# Patient Record
Sex: Female | Born: 1940 | Race: White | Hispanic: No | Smoking: Never smoker
Health system: Southern US, Community
[De-identification: ages and names within clinical notes are randomized; demographics above are authoritative.]

## PROBLEM LIST (undated history)

## (undated) DIAGNOSIS — F419 Anxiety disorder, unspecified: Secondary | ICD-10-CM

## (undated) DIAGNOSIS — K219 Gastro-esophageal reflux disease without esophagitis: Secondary | ICD-10-CM

## (undated) DIAGNOSIS — M1990 Unspecified osteoarthritis, unspecified site: Secondary | ICD-10-CM

## (undated) DIAGNOSIS — I82409 Acute embolism and thrombosis of unspecified deep veins of unspecified lower extremity: Secondary | ICD-10-CM

## (undated) DIAGNOSIS — F319 Bipolar disorder, unspecified: Secondary | ICD-10-CM

## (undated) HISTORY — PX: KNEE ARTHROSCOPY: SUR90

---

## 2011-10-17 ENCOUNTER — Emergency Department (HOSPITAL_COMMUNITY): Payer: Medicare Other

## 2011-10-17 ENCOUNTER — Encounter (HOSPITAL_COMMUNITY): Payer: Self-pay | Admitting: Emergency Medicine

## 2011-10-17 ENCOUNTER — Emergency Department (HOSPITAL_COMMUNITY)
Admission: EM | Admit: 2011-10-17 | Discharge: 2011-10-18 | Disposition: A | Discharge status: Other Acute Inpatient Hospital | Payer: Medicare Other | Attending: Emergency Medicine | Admitting: Emergency Medicine

## 2011-10-17 DIAGNOSIS — F29 Unspecified psychosis not due to a substance or known physiological condition: Secondary | ICD-10-CM | POA: Insufficient documentation

## 2011-10-17 DIAGNOSIS — Z9181 History of falling: Secondary | ICD-10-CM | POA: Insufficient documentation

## 2011-10-17 DIAGNOSIS — R509 Fever, unspecified: Secondary | ICD-10-CM | POA: Insufficient documentation

## 2011-10-17 DIAGNOSIS — F22 Delusional disorders: Secondary | ICD-10-CM | POA: Insufficient documentation

## 2011-10-17 DIAGNOSIS — F319 Bipolar disorder, unspecified: Secondary | ICD-10-CM | POA: Insufficient documentation

## 2011-10-17 DIAGNOSIS — R5381 Other malaise: Secondary | ICD-10-CM | POA: Insufficient documentation

## 2011-10-17 DIAGNOSIS — R4182 Altered mental status, unspecified: Secondary | ICD-10-CM | POA: Insufficient documentation

## 2011-10-17 HISTORY — DX: Anxiety disorder, unspecified: F41.9

## 2011-10-17 HISTORY — DX: Acute embolism and thrombosis of unspecified deep veins of unspecified lower extremity: I82.409

## 2011-10-17 HISTORY — DX: Bipolar disorder, unspecified: F31.9

## 2011-10-17 LAB — RAPID URINE DRUG SCREEN, HOSP PERFORMED
Amphetamines: NOT DETECTED
Barbiturates: NOT DETECTED
Tetrahydrocannabinol: NOT DETECTED

## 2011-10-17 LAB — URINALYSIS, ROUTINE W REFLEX MICROSCOPIC
Glucose, UA: NEGATIVE mg/dL
Leukocytes, UA: NEGATIVE
Specific Gravity, Urine: 1.025 (ref 1.005–1.030)
pH: 6 (ref 5.0–8.0)

## 2011-10-17 LAB — BASIC METABOLIC PANEL
CO2: 27 mEq/L (ref 19–32)
Chloride: 103 mEq/L (ref 96–112)
Creatinine, Ser: 0.62 mg/dL (ref 0.50–1.10)

## 2011-10-17 LAB — DIFFERENTIAL
Basophils Absolute: 0 10*3/uL (ref 0.0–0.1)
Lymphocytes Relative: 21 % (ref 12–46)
Monocytes Absolute: 0.4 10*3/uL (ref 0.1–1.0)
Monocytes Relative: 8 % (ref 3–12)
Neutro Abs: 3.8 10*3/uL (ref 1.7–7.7)

## 2011-10-17 LAB — CBC
HCT: 38.7 % (ref 36.0–46.0)
Hemoglobin: 12.6 g/dL (ref 12.0–15.0)
RDW: 13.2 % (ref 11.5–15.5)
WBC: 5.5 10*3/uL (ref 4.0–10.5)

## 2011-10-17 LAB — HEPATIC FUNCTION PANEL
ALT: 11 U/L (ref 0–35)
Alkaline Phosphatase: 74 U/L (ref 39–117)
Total Protein: 7.3 g/dL (ref 6.0–8.3)

## 2011-10-17 MED ORDER — OLANZAPINE 2.5 MG PO TABS
2.5000 mg | ORAL_TABLET | Freq: Every day | ORAL | Status: DC
  Administered 2011-10-18: 2.5 mg via ORAL
  Filled 2011-10-17 (×3): qty 1

## 2011-10-17 MED ORDER — VENLAFAXINE HCL ER 75 MG PO CP24
75.0000 mg | ORAL_CAPSULE | Freq: Every morning | ORAL | Status: DC
Start: 2011-10-17 — End: 2011-10-19
  Administered 2011-10-17 – 2011-10-18 (×2): 75 mg via ORAL
  Filled 2011-10-17 (×6): qty 1

## 2011-10-17 NOTE — ED Notes (Addendum)
Sons called EMS due to pt being paranoid. Putting phone off hook and continually looking out windows and doors. Sons state does not see difference in confusion, just the paranoia. Husband told ems that she has not urinated or had bm x 1 week. Sons say they just saw her urinate just yesterday and father exaggerates a lot. Pt is alert/withdrawn. Answers simple questions. States she has some pain in her back-chronic. Pt follows commands at this time. Moving all extremities. Pupils perrla. No resp distress. Nondiaphoretic. Pt denies si/hi

## 2011-10-17 NOTE — ED Provider Notes (Signed)
History   This chart was scribed for [employer]. Rubin Payor, MD by Clarita Crane. The patient was seen in room APA02/APA02. Patient's care was started at 0946.    CSN: [id number]  Arrival date & time 10/17/11  4098   First MD Initiated Contact with Patient 10/17/11 1000      Chief Complaint  Patient presents with  . Altered Mental Status    (Consider location/radiation/quality/duration/timing/severity/associated sxs/prior treatment) HPI A Level 5 Caveat applies due to Altered Mental Status Staley Jeansonne is a 71 y.o. female who presents to the Emergency Department accompanied by family members who reports patient has displayed gradually worsening altered mental status described as increased paranoia and confusion that began 10 days ago. Son reports that patient has also displayed increased fatigue and subjective fever the past several days. Patient's sons also note that patient sustained fall yesterday with associated head injury. Denies abdominal pain, chest pain, HA, neck pain. Family members not patient with h/o abuse of Xanax and states patient was recently admitted to [facility] for an extended period of time and has not done well since she was discharged home from facility.   Past Medical History  Diagnosis Date  . Bipolar 1 disorder   . Anxiety   . DVT (deep venous thrombosis)     Past Surgical History  Procedure Date  . Knee arthroscopy     History reviewed. No pertinent family history.  History  Substance Use Topics  . Smoking status: Never Smoker   . Smokeless tobacco: Not on file  . Alcohol Use: No  -Lives with spouse  OB History    Grav Para Term Preterm Abortions TAB SAB Ect Mult Living                  Review of Systems  Unable to perform ROS: Mental status change     Allergies  Penicillins  Home Medications  No current outpatient prescriptions on file.  BP 97/57  Pulse 68  Temp(Src) 98.3 F (36.8 C) (Oral)  Resp 18  Ht 5\' 6"  (1.676  m)  Wt 110 lb (49.896 kg)  BMI 17.75 kg/m2  SpO2 97%  Physical Exam  Nursing note and vitals reviewed. Constitutional: She appears well-developed and well-nourished. No distress.  HENT:  Head: Normocephalic and atraumatic.  Eyes: EOM are normal. Pupils are equal, round, and reactive to light.  Neck: Neck supple. No tracheal deviation present.  Cardiovascular: Normal rate.   Pulmonary/Chest: Effort normal. No respiratory distress.  Abdominal: Soft. She exhibits no distension.  Musculoskeletal: Normal range of motion. She exhibits no edema.       C-spine non-tender.  Neurological: She is alert. No sensory deficit.       Moves all extremities. Decreased cooperativeness. Answers yes and no to questions.    Skin: Skin is warm and dry.  Psychiatric: She has a normal mood and affect. Her behavior is normal.    ED Course  Procedures (including critical care time)  DIAGNOSTIC STUDIES: Oxygen Saturation is 97% on room air, normal by my interpretation.    COORDINATION OF CARE: 10:08AM- History obtained from patient's 2 sons outside of exam room. Current plan explained and discussed. Patient's sons agree with plan at this time.    Labs Reviewed  BASIC METABOLIC PANEL - Abnormal; Notable for the following:    GFR calc non Af Amer 87 (*)    All other components within normal limits  URINALYSIS, ROUTINE W REFLEX MICROSCOPIC  CBC  DIFFERENTIAL  HEPATIC  FUNCTION PANEL  URINE RAPID DRUG SCREEN (HOSP PERFORMED)   Dg Chest 2 View  10/17/2011  *RADIOLOGY REPORT*  Clinical Data: Confusion and mental status changes.  CHEST - 2 VIEW  Comparison: None.  Findings: The lungs are clear and show no evidence of edema or infiltrate.  No nodules or effusions are identified.  The heart size and mediastinal contours are unremarkable.  In the lateral projection, there is evidence of mild loss of height of the T8 vertebral body, likely representing mild osteoporotic compression with approximately 30% loss of  mid vertebral body height.  IMPRESSION: No active disease.  Mild compression deformity of the T8 vertebral body.  Original Report Authenticated By: Reola Calkins, M.D.   Ct Head Wo Contrast  10/17/2011  *RADIOLOGY REPORT*  Clinical Data: Altered mental status  CT HEAD WITHOUT CONTRAST  Technique:  Contiguous axial images were obtained from the base of the skull through the vertex without contrast.  Comparison: None.  Findings: Normal brain volume for age.  Ventricle size is normal. Small hypodensity left parietal white matter appears chronic.  No acute infarct, hemorrhage, or mass.  No skull lesions.  IMPRESSION: No acute abnormality.  Original Report Authenticated By: Camelia Phenes, M.D.     No diagnosis found. Bipolar disorder   Date: 10/17/2011  Rate: 68  Rhythm: normal sinus rhythm  QRS Axis: normal  Intervals: normal  ST/T Wave abnormalities: normal  Conduction Disutrbances:none  Narrative Interpretation:   Old EKG Reviewed: none available     MDM  Patient with increasing altered mental status at home. She's had slight issues is taking care of her husband. She's had been inpatient at [facility]. Laboratories overall reassuring. She appears to be medically cleared at this time. She's been seen by ACT team and is pending placement.     I personally performed the services described in this documentation, which was scribed in my presence. The recorded information has been reviewed and considered.    Juliet Rude. Rubin Payor, MD 10/17/11 1434
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
## 2011-10-17 NOTE — ED Notes (Signed)
Spoke with Roxanne at [facility]. Will call back about pt

## 2011-10-17 NOTE — ED Notes (Signed)
Pt refusing to eat dinner.

## 2011-10-17 NOTE — ED Notes (Signed)
Warm blanket given to pt

## 2011-10-18 NOTE — ED Notes (Signed)
Pt voided large amount in bedside commode.

## 2011-10-18 NOTE — ED Notes (Signed)
Patient resting quietly in bed; continues to be nonverbal.

## 2011-10-18 NOTE — ED Notes (Signed)
Gave pt. Bed bath .Marland Kitchen Pt talked a little.Marland Kitchen Opened eyes. Tried to feed pt but pt refused. Drank a little water

## 2011-10-18 NOTE — ED Notes (Signed)
Pt sitting up with eyes open and talking. Drinks water from a straw while I hold a cup. Refuses to eat. States "just leave it sitting there". Pt states that she does not need to urinate.

## 2011-10-18 NOTE — ED Notes (Signed)
Left with Deputy  1 female and 1 female Technical sales engineer

## 2011-10-18 NOTE — Progress Notes (Signed)
Pt accepted for transfer to [facility] facility by Dr. Retia Passe.

## 2011-10-18 NOTE — ED Notes (Signed)
Awaiting transport - communication from [employer] - should be here in about 15 minutes.  Son sitting with patient. Informed.

## 2011-10-18 NOTE — ED Notes (Signed)
Pt slightly opens eyes to verbal stimuli. No verbal response. Moving arms.

## 2011-10-18 NOTE — ED Notes (Signed)
Pt remains nonverbal. Pt moves right arm but does not respond verbally or follow commands. Pt notified that her son called to check on her and family will be here to visit in a little while. Pt dry and clean. Cardiac monitor showing NSR.

## 2011-10-18 NOTE — ED Notes (Signed)
Pt's son in to visit. Pt eating lunch tray for son. Awake and alert at this time. Pt still does not need to use bedpan. Pharmacy notified to bring patient her med so that her son can help with administering it.

## 2011-10-18 NOTE — ED Notes (Signed)
Pt lying in bed with eyes closed. Does not respond to verbal or tactile stimuli. Pt showing NSR on cardiac monitor. Respirations deep and even. Breakfast tray left at bedside and patient notified.

## 2011-10-18 NOTE — Progress Notes (Signed)
Spoke with April who confirmed pt is accepted to Dr. Janice Norrie at [facility], however she has requested an affidavit and transportation. Informed her we have always transported to [facility] on Emergency Certificate which has the transportation order on the back page and the emergency certificate. She will check on this and call back. Discussed with pt's nurse.

## 2011-10-18 NOTE — ED Notes (Signed)
Still waiting for paperwork to be done and final approval from [place]. Pt remains alert and oriented. Family at bedside. No complaints at this time.

## 2011-10-18 NOTE — ED Notes (Signed)
Pt has been accepted to [facility] pending IVC papers being faxed. Dr. Ignacia Palma working on papers.

## 2011-10-18 NOTE — ED Notes (Signed)
Pt turned on her right side. Pt has no redness or skin breakdown noted. Pt drank water through a straw but kept spitting out her pill and shaking her head no. Pt dry and clean. Bedpan offered.

## 2011-10-18 NOTE — ED Notes (Signed)
Report called to [facility] to Celestia Khat, RN  Dep[place] [employer] here and talked with patient and family.  He is making arrangments for transport officer to come to [facility] rather than take patient to sheriffs office - This is being done in best interest of this patient who has paranoia related to being arrested.    Patient placed in her own pajama's.  Responding appropriately and assisted with getting out of her gown and clothing on.  Denies need for toileting at this time.  Has one gold colored ring on left ring finger. Dentures - upper and lower in mouth.

## 2011-10-18 NOTE — ED Notes (Signed)
Pt awake and alert. Moving about on bed with no difficulty. Taking po liquids and ate 1/4 of lunch tray. Pt's son at bedside. Pt has no needs at this time.

## 2011-10-18 NOTE — ED Notes (Signed)
Patient resting quietly in bed with eyes closed.  Patient will open eyes when door to room is opened; however remains nonverbal to all questions.  Patient pulls cover up further when approached by staff.

## 2011-10-18 NOTE — ED Notes (Signed)
Pt accepted to [facility] 

## 2011-10-18 NOTE — ED Notes (Signed)
Swallowed medication without complaint or difficulty.  Son states this is the best he has seen his mother in the past two weeks.  Cooperative.

## 2011-10-18 NOTE — ED Notes (Signed)
Patient resting quietly in bed; patient does not respond when asked questions.  Will continue to monitor.

## 2011-10-18 NOTE — ED Notes (Signed)
Daughter in law in to visit with patient. Opens eyes but no verbal response noted.

## 2011-10-18 NOTE — Progress Notes (Signed)
Staff at [facility] are considering accepting pt but want her involuntarily committed.  I completed involuntary commitment papers on her.  She is resting comfortably, with her son at the bedside.

## 2011-10-18 NOTE — ED Notes (Signed)
Patient remains nonverbal to all questions.  Respirations even and unlabored, equal rise and fall of chest noted.

## 2011-10-18 NOTE — ED Notes (Signed)
Pt still not opening eyes for family or for me. Pt does not follow commands. Pt sat up and took a couple very small sips of water while I held the cup to her lips. Remains non verbal.

## 2011-10-18 NOTE — Progress Notes (Signed)
Spoke with April at [facility] who reports the Doctor was unable to review case yesterday but it will be reviewed today.

## 2011-10-18 NOTE — ED Notes (Signed)
Pt sitting on stretcher feeding herself dinner. Family at bedside. Pt talking and answering questions. No c/o pain at this time. Follows commands at this time. Calm and cooperative.

## 2013-08-12 IMAGING — CR DG CHEST 2V
2 series · 2 of 2 positions shown · non-contrast
Comparison: None.

CLINICAL DATA: Confusion and mental status changes.

CHEST - 2 VIEW

[view not recorded (1 of 2)]
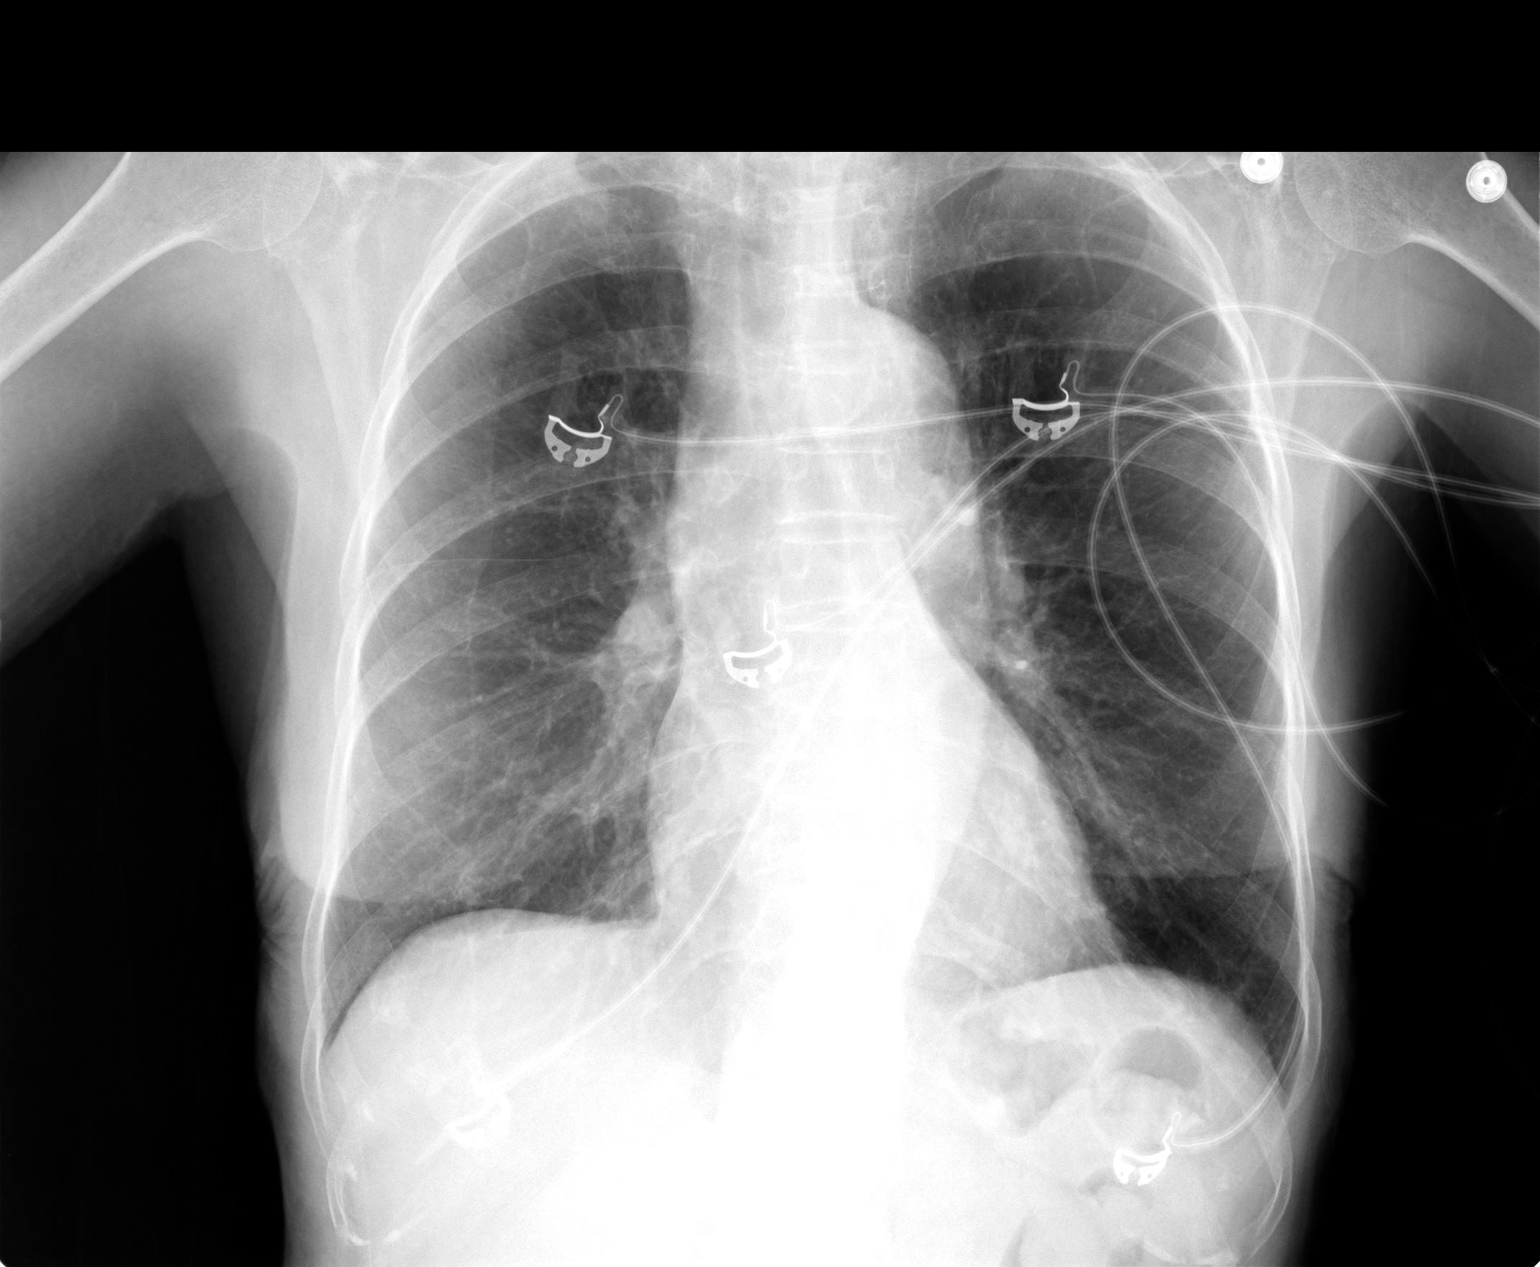

[view not recorded (2 of 2)]
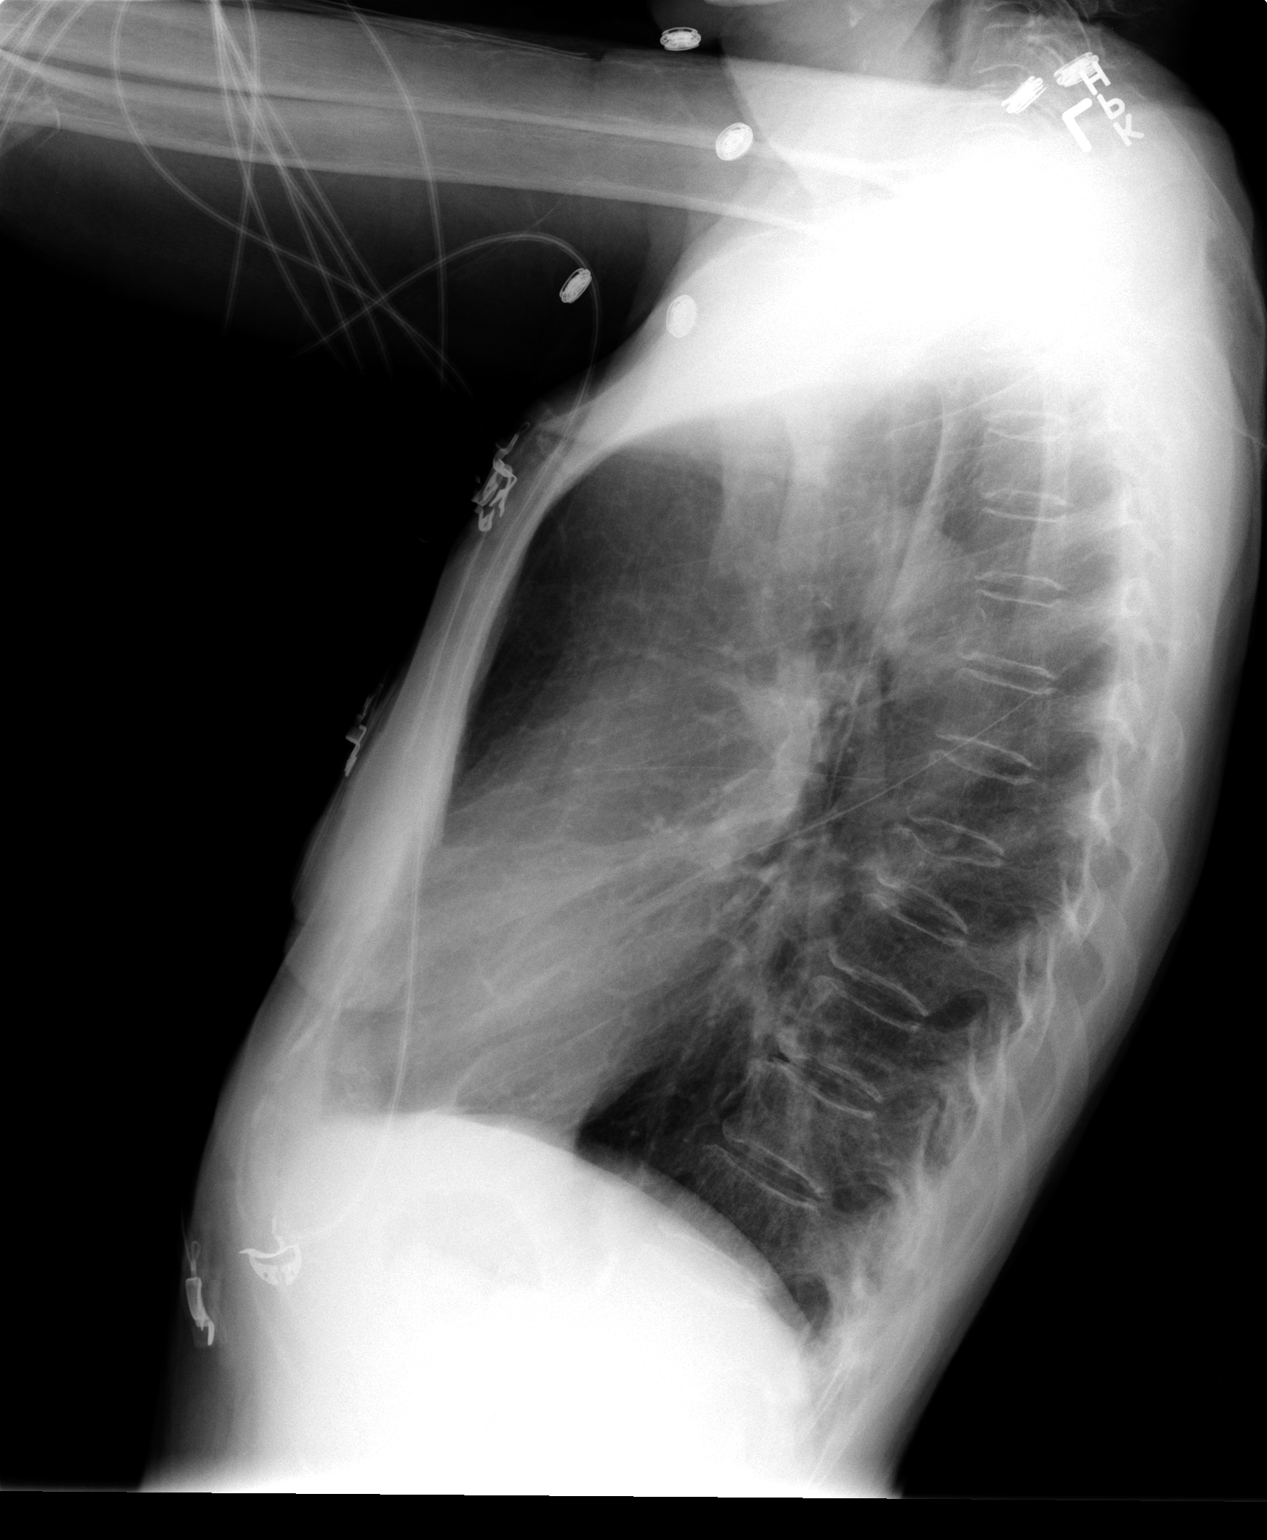

[2 of 2 positions shown; findings below may reference images not displayed]

FINDINGS: The lungs are clear and show no evidence of edema or
infiltrate.  No nodules or effusions are identified.  The heart
size and mediastinal contours are unremarkable.  In the lateral
projection, there is evidence of mild loss of height of the T8
vertebral body, likely representing mild osteoporotic compression
with approximately 30% loss of mid vertebral body height.
IMPRESSION: No active disease.  Mild compression deformity of the T8 vertebral
body.

## 2016-04-23 NOTE — Patient Instructions (Signed)
[facility]  04/23/2016     @PREFPERIOPPHARMACY @   Your procedure is scheduled on 04/29/2016.  Report to [facility] at 6:15 A.M.  Call this number if you have problems the morning of surgery:  [phone]   Remember:  Do not eat food or drink liquids after midnight.  Take these medicines the morning of surgery with A SIP OF WATER Remeron, Prilosec   Do not wear jewelry, make-up or nail polish.  Do not wear lotions, powders, or perfumes, or deoderant.  Do not shave 48 hours prior to surgery.  Men may shave face and neck.  Do not bring valuables to the hospital.  [facility] is not responsible for any belongings or valuables.  Contacts, dentures or bridgework may not be worn into surgery.  Leave your suitcase in the car.  After surgery it may be brought to your room.  For patients admitted to the hospital, discharge time will be determined by your treatment team.  Patients discharged the day of surgery will not be allowed to drive home.    Please read over the following fact sheets that you were given. Anesthesia Post-op Instructions     PATIENT INSTRUCTIONS POST-ANESTHESIA  IMMEDIATELY FOLLOWING SURGERY:  Do not drive or operate machinery for the first twenty four hours after surgery.  Do not make any important decisions for twenty four hours after surgery or while taking narcotic pain medications or sedatives.  If you develop intractable nausea and vomiting or a severe headache please notify your doctor immediately.  FOLLOW-UP:  Please make an appointment with your surgeon as instructed. You do not need to follow up with anesthesia unless specifically instructed to do so.  WOUND CARE INSTRUCTIONS (if applicable):  Keep a dry clean dressing on the anesthesia/puncture wound site if there is drainage.  Once the wound has quit draining you may leave it open to air.  Generally you should leave the bandage intact for twenty four hours unless there is drainage.  If the  epidural site drains for more than 36-48 hours please call the anesthesia department.  QUESTIONS?:  Please feel free to call your physician or the hospital operator if you have any questions, and they will be happy to assist you.      Cataract Surgery Cataract surgery is a procedure to remove a cataract from your eye. A cataract is cloudiness on the lens of your eye. The lens focuses light inside the eye. When a lens becomes cloudy, your vision is affected. Cataract surgery is a procedure to remove the cloudy lens. A substitute lens (intraocular lens or IOL) is usually inserted as a replacement for the cloudy lens. Tell a health care provider about:  Any allergies you have.  All medicines you are taking, including vitamins, herbs, eye drops, creams, and over-the-counter medicines.  Any problems you or family members have had with anesthetic medicines.  Any blood disorders you have.  Any surgeries you have had, especially eye surgeries that include refractive surgery, such as PRK and LASIK.  Any medical conditions you have.  Whether you are pregnant or may be pregnant. What are the risks? Generally, this is a safe procedure. However, problems may occur, including:  Infection.  Bleeding.  Glaucoma.  Retinal detachment.  Allergic reactions to medicines.  Damage to other structures or organs.  Inflammation of the eye.  Clouding of the part of your eye that holds an IOL in place (after-cataract), if an IOL was inserted. This is fairly common.  An IOL moving out of position, if an IOL was inserted. This is very rare.  Loss of vision. This is rare. What happens before the procedure?  Follow instructions from your health care provider about eating or drinking restrictions.  Ask your health care provider about:  Changing or stopping your regular medicines, including any eye drops you have been prescribed. This is especially important if you are taking diabetes medicines or  blood thinners.  Taking medicines such as aspirin and ibuprofen. These medicines can thin your blood. Do not take these medicines before your procedure if your health care provider instructs you not to.  Do not put contact lenses in either eye on the day of your surgery.  Plan for someone to drive you to and from the procedure.  If you will be going home right after the procedure, plan to have someone with you for 24 hours. What happens during the procedure?  An IV tube may be inserted into one of your veins.  You will be given one or more of the following:  A medicine to help you relax (sedative).  A medicine to numb the area (local anesthetic). This may be numbing eye drops or an injection that is given behind the eye.  A small cut (incision) will be made to the edge of the clear, dome-shaped surface that covers the front of the eye (cornea).  A small probe will be inserted into the eye. This device gives off ultrasound waves that soften and break up the cloudy center of the lens. This makes it easier for the cloudy lens to be removed by suction.  An IOL may be implanted.  Part of the capsule that surrounds the lens will be left in the eye to support the IOL.  Your surgeon may use stitches (sutures) to close the incision. The procedure may vary among health care providers and hospitals. What happens after the procedure?  Your blood pressure, heart rate, breathing rate, and blood oxygen level will be monitored often until the medicines you were given have worn off.  You may be given a protective shield to wear over your eyes.  Do not drive for 24 hours if you received a sedative. This information is not intended to replace advice given to you by your health care provider. Make sure you discuss any questions you have with your health care provider. Document Released: 04/25/2011 Document Revised: 10/12/2015 Document Reviewed: 03/16/2015 Elsevier Interactive Patient Education   2017 [employer].

## 2016-04-24 ENCOUNTER — Encounter (HOSPITAL_COMMUNITY): Payer: Self-pay

## 2016-04-24 ENCOUNTER — Encounter (HOSPITAL_COMMUNITY)
Admission: RE | Admit: 2016-04-24 | Discharge: 2016-04-24 | Disposition: A | Discharge status: Home / Self Care | Payer: Medicare Other | Source: Ambulatory Visit | Attending: Ophthalmology | Admitting: Ophthalmology

## 2016-04-24 DIAGNOSIS — Z01810 Encounter for preprocedural cardiovascular examination: Secondary | ICD-10-CM | POA: Diagnosis present

## 2016-04-24 DIAGNOSIS — Z86718 Personal history of other venous thrombosis and embolism: Secondary | ICD-10-CM | POA: Diagnosis not present

## 2016-04-24 DIAGNOSIS — F319 Bipolar disorder, unspecified: Secondary | ICD-10-CM | POA: Diagnosis not present

## 2016-04-24 DIAGNOSIS — K219 Gastro-esophageal reflux disease without esophagitis: Secondary | ICD-10-CM | POA: Diagnosis not present

## 2016-04-24 DIAGNOSIS — F419 Anxiety disorder, unspecified: Secondary | ICD-10-CM | POA: Diagnosis not present

## 2016-04-24 DIAGNOSIS — Z01812 Encounter for preprocedural laboratory examination: Secondary | ICD-10-CM | POA: Diagnosis not present

## 2016-04-24 DIAGNOSIS — M1990 Unspecified osteoarthritis, unspecified site: Secondary | ICD-10-CM | POA: Diagnosis not present

## 2016-04-24 HISTORY — DX: Unspecified osteoarthritis, unspecified site: M19.90

## 2016-04-24 HISTORY — DX: Gastro-esophageal reflux disease without esophagitis: K21.9

## 2016-04-24 LAB — BASIC METABOLIC PANEL
ANION GAP: 6 (ref 5–15)
BUN: 15 mg/dL (ref 6–20)
CHLORIDE: 105 mmol/L (ref 101–111)
CO2: 27 mmol/L (ref 22–32)
Calcium: 9.3 mg/dL (ref 8.9–10.3)
Creatinine, Ser: 0.74 mg/dL (ref 0.44–1.00)
GFR calc non Af Amer: >60 mL/min (ref >60)
Glucose, Bld: 95 mg/dL (ref 65–99)
POTASSIUM: 4.5 mmol/L (ref 3.5–5.1)
SODIUM: 138 mmol/L (ref 135–145)

## 2016-04-24 LAB — CBC WITH DIFFERENTIAL/PLATELET
BASOS PCT: 1 %
Basophils Absolute: 0.1 10*3/uL (ref 0.0–0.1)
EOS ABS: 0.2 10*3/uL (ref 0.0–0.7)
Eosinophils Relative: 3 %
HCT: 37.4 % (ref 36.0–46.0)
HEMOGLOBIN: 11.9 g/dL — AB (ref 12.0–15.0)
Lymphocytes Relative: 32 %
Lymphs Abs: 1.8 10*3/uL (ref 0.7–4.0)
MCH: 29.1 pg (ref 26.0–34.0)
MCHC: 31.8 g/dL (ref 30.0–36.0)
MCV: 91.4 fL (ref 78.0–100.0)
Monocytes Absolute: 0.6 10*3/uL (ref 0.1–1.0)
Monocytes Relative: 10 %
NEUTROS ABS: 3 10*3/uL (ref 1.7–7.7)
NEUTROS PCT: 54 %
Platelets: 319 10*3/uL (ref 150–400)
RBC: 4.09 MIL/uL (ref 3.87–5.11)
RDW: 13.8 % (ref 11.5–15.5)
WBC: 5.6 10*3/uL (ref 4.0–10.5)

## 2016-04-29 ENCOUNTER — Ambulatory Visit (HOSPITAL_COMMUNITY)
Admission: RE | Admit: 2016-04-29 | Discharge: 2016-04-29 | Disposition: A | Discharge status: Home / Self Care | Payer: Medicare Other | Source: Ambulatory Visit | Attending: Ophthalmology | Admitting: Ophthalmology

## 2016-04-29 ENCOUNTER — Ambulatory Visit (HOSPITAL_COMMUNITY): Payer: Medicare Other | Admitting: Anesthesiology

## 2016-04-29 ENCOUNTER — Encounter (HOSPITAL_COMMUNITY)
Admission: RE | Disposition: A | Discharge status: Home / Self Care | Payer: Self-pay | Source: Ambulatory Visit | Attending: Ophthalmology

## 2016-04-29 ENCOUNTER — Encounter (HOSPITAL_COMMUNITY): Payer: Self-pay | Admitting: *Deleted

## 2016-04-29 DIAGNOSIS — F419 Anxiety disorder, unspecified: Secondary | ICD-10-CM | POA: Diagnosis not present

## 2016-04-29 DIAGNOSIS — H2589 Other age-related cataract: Secondary | ICD-10-CM | POA: Insufficient documentation

## 2016-04-29 DIAGNOSIS — Z86718 Personal history of other venous thrombosis and embolism: Secondary | ICD-10-CM | POA: Diagnosis not present

## 2016-04-29 DIAGNOSIS — Z79899 Other long term (current) drug therapy: Secondary | ICD-10-CM | POA: Insufficient documentation

## 2016-04-29 DIAGNOSIS — K219 Gastro-esophageal reflux disease without esophagitis: Secondary | ICD-10-CM | POA: Insufficient documentation

## 2016-04-29 HISTORY — PX: CATARACT EXTRACTION W/PHACO: SHX586

## 2016-04-29 SURGERY — PHACOEMULSIFICATION, CATARACT, WITH IOL INSERTION
Anesthesia: Monitor Anesthesia Care | Site: Eye | Laterality: Right

## 2016-04-29 MED ORDER — BSS IO SOLN
INTRAOCULAR | Status: DC | PRN
  Administered 2016-04-29: 15 mL

## 2016-04-29 MED ORDER — LACTATED RINGERS IV SOLN
INTRAVENOUS | Status: DC
  Administered 2016-04-29: 07:00:00 via INTRAVENOUS

## 2016-04-29 MED ORDER — LIDOCAINE HCL 3.5 % OP GEL
1.0000 "application " | Freq: Once | OPHTHALMIC | Status: AC
  Administered 2016-04-29: 1 via OPHTHALMIC

## 2016-04-29 MED ORDER — TRYPAN BLUE 0.06 % OP SOLN
OPHTHALMIC | Status: DC | PRN
  Administered 2016-04-29: 0.5 mL via INTRAOCULAR

## 2016-04-29 MED ORDER — EPINEPHRINE PF 1 MG/ML IJ SOLN
INTRAMUSCULAR | Status: AC
  Filled 2016-04-29: qty 1

## 2016-04-29 MED ORDER — FENTANYL CITRATE (PF) 100 MCG/2ML IJ SOLN
25.0000 ug | INTRAMUSCULAR | Status: AC | PRN
  Administered 2016-04-29 (×2): 25 ug via INTRAVENOUS
  Filled 2016-04-29: qty 2

## 2016-04-29 MED ORDER — TETRACAINE HCL 0.5 % OP SOLN
1.0000 [drp] | OPHTHALMIC | Status: AC
Start: 2016-04-29 — End: 2016-04-29
  Administered 2016-04-29 (×3): 1 [drp] via OPHTHALMIC

## 2016-04-29 MED ORDER — CYCLOPENTOLATE-PHENYLEPHRINE 0.2-1 % OP SOLN
1.0000 [drp] | OPHTHALMIC | Status: AC
  Administered 2016-04-29 (×3): 1 [drp] via OPHTHALMIC

## 2016-04-29 MED ORDER — EPINEPHRINE PF 1 MG/ML IJ SOLN
INTRAOCULAR | Status: DC | PRN
  Administered 2016-04-29: 500 mL

## 2016-04-29 MED ORDER — POVIDONE-IODINE 5 % OP SOLN
OPHTHALMIC | Status: DC | PRN
  Administered 2016-04-29: 1 via OPHTHALMIC

## 2016-04-29 MED ORDER — NEOMYCIN-POLYMYXIN-DEXAMETH 3.5-10000-0.1 OP SUSP
OPHTHALMIC | Status: DC | PRN
  Administered 2016-04-29: 2 [drp] via OPHTHALMIC

## 2016-04-29 MED ORDER — PHENYLEPHRINE HCL 2.5 % OP SOLN
1.0000 [drp] | OPHTHALMIC | Status: AC
  Administered 2016-04-29 (×3): 1 [drp] via OPHTHALMIC

## 2016-04-29 MED ORDER — MIDAZOLAM HCL 2 MG/2ML IJ SOLN
1.0000 mg | INTRAMUSCULAR | Status: DC | PRN
  Administered 2016-04-29 (×2): 1 mg via INTRAVENOUS
  Filled 2016-04-29: qty 2

## 2016-04-29 MED ORDER — TRYPAN BLUE 0.06 % OP SOLN
OPHTHALMIC | Status: AC
  Filled 2016-04-29: qty 0.5

## 2016-04-29 MED ORDER — LIDOCAINE HCL (PF) 1 % IJ SOLN
INTRAOCULAR | Status: DC | PRN
  Administered 2016-04-29: .8 mL via OPHTHALMIC

## 2016-04-29 MED ORDER — NA HYALUR & NA CHOND-NA HYALUR 0.55-0.5 ML IO KIT
PACK | INTRAOCULAR | Status: DC | PRN
  Administered 2016-04-29: 1 via OPHTHALMIC

## 2016-04-29 SURGICAL SUPPLY — 12 items
CLOTH BEACON ORANGE TIMEOUT ST (SAFETY) ×3 IMPLANT
EYE SHIELD UNIVERSAL CLEAR (GAUZE/BANDAGES/DRESSINGS) ×3 IMPLANT
GLOVE BIOGEL PI IND STRL 6.5 (GLOVE) ×1 IMPLANT
GLOVE BIOGEL PI IND STRL 8 (GLOVE) ×1 IMPLANT
GLOVE BIOGEL PI INDICATOR 6.5 (GLOVE) ×2
GLOVE BIOGEL PI INDICATOR 8 (GLOVE) ×2
PAD ARMBOARD 7.5X6 YLW CONV (MISCELLANEOUS) ×3 IMPLANT
SIGHTPATH CAT PROC W REG LENS (Ophthalmic Related) ×3 IMPLANT
SYRINGE LUER LOK 1CC (MISCELLANEOUS) ×3 IMPLANT
TAPE SURG TRANSPORE 1 IN (GAUZE/BANDAGES/DRESSINGS) ×1 IMPLANT
TAPE SURGICAL TRANSPORE 1 IN (GAUZE/BANDAGES/DRESSINGS) ×2
WATER STERILE IRR 250ML POUR (IV SOLUTION) ×3 IMPLANT

## 2016-04-29 NOTE — H&P (Signed)
I have reviewed the H&P, the patient was re-examined, and I have identified no interval changes in medical condition and plan of care since the history and physical of record  

## 2016-04-29 NOTE — Transfer of Care (Signed)
Immediate Anesthesia Transfer of Care Note  Patient: [facility]ilar  Procedure(s) Performed: Procedure(s) with comments: CATARACT EXTRACTION PHACO AND INTRAOCULAR LENS PLACEMENT RIGHT EYE CDE=72.03 (Right) - right  Patient Location: Short Stay  Anesthesia Type:MAC  Level of Consciousness: awake and patient cooperative  Airway & Oxygen Therapy: Patient Spontanous Breathing  Post-op Assessment: Report given to RN, Post -op Vital signs reviewed and stable and Patient moving all extremities  Post vital signs: Reviewed and stable  Last Vitals:  Vitals:   04/29/16 0745 04/29/16 0750  BP: (!) 132/59 121/89  Pulse:    Resp: 16 11  Temp:      Last Pain:  Vitals:   04/29/16 0719  TempSrc: Oral      Patients Stated Pain Goal: 5 (04/29/16 0719)  Complications: No apparent anesthesia complications

## 2016-04-29 NOTE — Op Note (Signed)
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  Assistants: None  Anesthesia: Topical with MAC  Indications: Painless, progressive loss of vision with compromise of daily activities.  Surgery: Cataract Extraction with Intraocular lens Implant Right Eye  Discription: The patient had dilating drops and viscous lidocaine placed into the Right eye in the pre-op holding area. After transfer to the operating room, a time out was performed. The patient was then prepped and draped. Beginning with a 75 degree blade a paracentesis port was made at the surgeon's 2 o'clock position. The anterior chamber was then filled with 1% non-preserved lidocaine. The anterior chamber was then filled with Vision Blue to stain the anterior capsule. The Vision Blue was displaced from the anterior chamber with BSS. This was followed by filling the anterior chamber with Viscoat. A 2.264mm keratome blade was used to make a clear corneal incision at the temporal limbus. A bent cystatome needle was used to create a continuous tear capsulotomy. Hydrodissection was performed with balanced salt solution on a Fine canula. The lens nucleus was then removed using the phacoemulsification handpiece. Residual cortex was removed with the I&A handpiece. The anterior chamber and capsular bag were refilled with Provisc. A posterior chamber intraocular lens was placed into the capsular bag with it's injector. The implant was positioned with the Kuglan hook. The Provisc was then removed from the anterior chamber and capsular bag with the I&A handpiece. Stromal hydration of the main incision and paracentesis port was performed with BSS on a Fine canula. The wounds were tested for leak which was negative. The patient tolerated the procedure well. There were no operative complications. The patient was then  transferred to the recovery room in stable condition.  Complications: None  Specimen: None  EBL: None  Prosthetic device: Abbott Technis,PCB00, power 24.5D, SN [id number].

## 2016-04-29 NOTE — Anesthesia Postprocedure Evaluation (Signed)
Anesthesia Post Note  Patient: Kayia Schweiss  Procedure(s) Performed: Procedure(s) (LRB): CATARACT EXTRACTION PHACO AND INTRAOCULAR LENS PLACEMENT RIGHT EYE CDE=72.03 (Right)  Patient location during evaluation: Short Stay Anesthesia Type: MAC Level of consciousness: awake and alert, oriented and patient cooperative Pain management: pain level controlled Vital Signs Assessment: post-procedure vital signs reviewed and stable Respiratory status: spontaneous breathing, nonlabored ventilation and respiratory function stable Cardiovascular status: blood pressure returned to baseline Postop Assessment: no signs of nausea or vomiting Anesthetic complications: no    Last Vitals:  Vitals:   04/29/16 0745 04/29/16 0750  BP: (!) 132/59 121/89  Pulse:    Resp: 16 11  Temp:      Last Pain:  Vitals:   04/29/16 0719  TempSrc: Oral                 Jena Tegeler J

## 2016-04-29 NOTE — Anesthesia Preprocedure Evaluation (Signed)
Anesthesia Evaluation  Patient identified by MRN, date of birth, ID band Patient awake    Reviewed: Allergy & Precautions, NPO status , Patient's Chart, lab work & pertinent test results  Airway Mallampati: III  TM Distance: >3 FB   Mouth opening: Limited Mouth Opening  Dental  (+) Edentulous Upper, Edentulous Lower   Pulmonary neg pulmonary ROS,    breath sounds clear to auscultation       Cardiovascular + DVT   Rhythm:Regular Rate:Normal     Neuro/Psych PSYCHIATRIC DISORDERS Anxiety Bipolar Disorder    GI/Hepatic GERD  ,  Endo/Other    Renal/GU      Musculoskeletal  (+) Arthritis ,   Abdominal   Peds  Hematology   Anesthesia Other Findings   Reproductive/Obstetrics                             Anesthesia Physical Anesthesia Plan  ASA: III  Anesthesia Plan: MAC   Post-op Pain Management:    Induction: Intravenous  Airway Management Planned: Nasal Cannula  Additional Equipment:   Intra-op Plan:   Post-operative Plan:   Informed Consent: I have reviewed the patients History and Physical, chart, labs and discussed the procedure including the risks, benefits and alternatives for the proposed anesthesia with the patient or authorized representative who has indicated his/her understanding and acceptance.     Plan Discussed with:   Anesthesia Plan Comments:         Anesthesia Quick Evaluation  

## 2016-04-29 NOTE — Discharge Instructions (Signed)
Monitored Anesthesia Care, Care After °These instructions provide you with information about caring for yourself after your procedure. Your health care provider may also give you more specific instructions. Your treatment has been planned according to current medical practices, but problems sometimes occur. Call your health care provider if you have any problems or questions after your procedure. °What can I expect after the procedure? °After your procedure, it is common to: °· Feel sleepy for several hours. °· Feel clumsy and have poor balance for several hours. °· Feel forgetful about what happened after the procedure. °· Have poor judgment for several hours. °· Feel nauseous or vomit. °· Have a sore throat if you had a breathing tube during the procedure. °Follow these instructions at home: °For at least 24 hours after the procedure:  °· Do not: °¨ Participate in activities in which you could fall or become injured. °¨ Drive. °¨ Use heavy machinery. °¨ Drink alcohol. °¨ Take sleeping pills or medicines that cause drowsiness. °¨ Make important decisions or sign legal documents. °¨ Take care of children on your own. °· Rest. °Eating and drinking °· Follow the diet that is recommended by your health care provider. °· If you vomit, drink water, juice, or soup when you can drink without vomiting. °· Make sure you have little or no nausea before eating solid foods. °General instructions °· Have a responsible adult stay with you until you are awake and alert. °· Take over-the-counter and prescription medicines only as told by your health care provider. °· If you smoke, do not smoke without supervision. °· Keep all follow-up visits as told by your health care provider. This is important. °Contact a health care provider if: °· You keep feeling nauseous or you keep vomiting. °· You feel light-headed. °· You develop a rash. °· You have a fever. °Get help right away if: °· You have trouble breathing. °This information is not  intended to replace advice given to you by your health care provider. Make sure you discuss any questions you have with your health care provider. °Document Released: 08/27/2015 Document Revised: 12/27/2015 Document Reviewed: 08/27/2015 °Elsevier Interactive Patient Education © 2017 Elsevier Inc. ° °

## 2016-05-02 ENCOUNTER — Encounter (HOSPITAL_COMMUNITY): Payer: Self-pay | Admitting: Ophthalmology

## 2016-05-24 ENCOUNTER — Encounter (HOSPITAL_COMMUNITY)
Admission: RE | Admit: 2016-05-24 | Discharge: 2016-05-24 | Disposition: A | Discharge status: Home / Self Care | Payer: Medicare Other | Source: Ambulatory Visit | Attending: Ophthalmology | Admitting: Ophthalmology

## 2016-05-30 ENCOUNTER — Encounter (HOSPITAL_COMMUNITY): Payer: Self-pay | Admitting: *Deleted

## 2016-05-30 ENCOUNTER — Encounter (HOSPITAL_COMMUNITY)
Admission: RE | Disposition: A | Discharge status: Home / Self Care | Payer: Self-pay | Source: Ambulatory Visit | Attending: Ophthalmology

## 2016-05-30 ENCOUNTER — Ambulatory Visit (HOSPITAL_COMMUNITY): Payer: Medicare Other | Admitting: Anesthesiology

## 2016-05-30 ENCOUNTER — Ambulatory Visit (HOSPITAL_COMMUNITY)
Admission: RE | Admit: 2016-05-30 | Discharge: 2016-05-30 | Disposition: A | Discharge status: Home / Self Care | Payer: Medicare Other | Source: Ambulatory Visit | Attending: Ophthalmology | Admitting: Ophthalmology

## 2016-05-30 DIAGNOSIS — Z86718 Personal history of other venous thrombosis and embolism: Secondary | ICD-10-CM | POA: Diagnosis not present

## 2016-05-30 DIAGNOSIS — H259 Unspecified age-related cataract: Secondary | ICD-10-CM | POA: Insufficient documentation

## 2016-05-30 HISTORY — PX: CATARACT EXTRACTION W/PHACO: SHX586

## 2016-05-30 SURGERY — PHACOEMULSIFICATION, CATARACT, WITH IOL INSERTION
Anesthesia: Monitor Anesthesia Care | Site: Eye | Laterality: Left

## 2016-05-30 MED ORDER — TETRACAINE HCL 0.5 % OP SOLN
1.0000 [drp] | OPHTHALMIC | Status: AC
  Administered 2016-05-30 (×3): 1 [drp] via OPHTHALMIC

## 2016-05-30 MED ORDER — PHENYLEPHRINE HCL 2.5 % OP SOLN
1.0000 [drp] | OPHTHALMIC | Status: AC
  Administered 2016-05-30 (×3): 1 [drp] via OPHTHALMIC

## 2016-05-30 MED ORDER — MIDAZOLAM HCL 2 MG/2ML IJ SOLN
0.5000 mg | INTRAMUSCULAR | Status: DC | PRN
  Administered 2016-05-30: 2 mg via INTRAVENOUS

## 2016-05-30 MED ORDER — NEOMYCIN-POLYMYXIN-DEXAMETH 3.5-10000-0.1 OP SUSP
OPHTHALMIC | Status: DC | PRN
  Administered 2016-05-30: 2 [drp] via OPHTHALMIC

## 2016-05-30 MED ORDER — LIDOCAINE HCL (PF) 1 % IJ SOLN
INTRAOCULAR | Status: DC | PRN
  Administered 2016-05-30: .8 mL via OPHTHALMIC

## 2016-05-30 MED ORDER — EPINEPHRINE PF 1 MG/ML IJ SOLN
INTRAMUSCULAR | Status: AC
  Filled 2016-05-30: qty 1

## 2016-05-30 MED ORDER — BSS IO SOLN
INTRAOCULAR | Status: DC | PRN
  Administered 2016-05-30: 15 mL via INTRAOCULAR

## 2016-05-30 MED ORDER — TRYPAN BLUE 0.06 % OP SOLN
OPHTHALMIC | Status: AC
  Filled 2016-05-30: qty 0.5

## 2016-05-30 MED ORDER — FENTANYL CITRATE (PF) 100 MCG/2ML IJ SOLN
25.0000 ug | INTRAMUSCULAR | Status: AC | PRN
  Administered 2016-05-30 (×2): 25 ug via INTRAVENOUS

## 2016-05-30 MED ORDER — FENTANYL CITRATE (PF) 100 MCG/2ML IJ SOLN
INTRAMUSCULAR | Status: AC
  Filled 2016-05-30: qty 2

## 2016-05-30 MED ORDER — LIDOCAINE HCL 3.5 % OP GEL
1.0000 "application " | Freq: Once | OPHTHALMIC | Status: AC
  Administered 2016-05-30: 1 via OPHTHALMIC

## 2016-05-30 MED ORDER — PROVISC 10 MG/ML IO SOLN
INTRAOCULAR | Status: DC | PRN
  Administered 2016-05-30: .85 mL via INTRAOCULAR

## 2016-05-30 MED ORDER — MIDAZOLAM HCL 2 MG/2ML IJ SOLN
INTRAMUSCULAR | Status: AC
  Filled 2016-05-30: qty 2

## 2016-05-30 MED ORDER — POVIDONE-IODINE 5 % OP SOLN
OPHTHALMIC | Status: DC | PRN
  Administered 2016-05-30: 1 via OPHTHALMIC

## 2016-05-30 MED ORDER — CYCLOPENTOLATE-PHENYLEPHRINE 0.2-1 % OP SOLN
1.0000 [drp] | OPHTHALMIC | Status: AC
  Administered 2016-05-30 (×3): 1 [drp] via OPHTHALMIC

## 2016-05-30 MED ORDER — LACTATED RINGERS IV SOLN
INTRAVENOUS | Status: DC
  Administered 2016-05-30: 1000 mL via INTRAVENOUS

## 2016-05-30 MED ORDER — EPINEPHRINE PF 1 MG/ML IJ SOLN
INTRAOCULAR | Status: DC | PRN
  Administered 2016-05-30: 500 mL

## 2016-05-30 MED ORDER — TRYPAN BLUE 0.06 % OP SOLN
OPHTHALMIC | Status: DC | PRN
  Administered 2016-05-30: 0.5 mL via INTRAOCULAR

## 2016-05-30 SURGICAL SUPPLY — 21 items

## 2016-05-30 NOTE — Discharge Instructions (Signed)

## 2016-05-30 NOTE — Anesthesia Postprocedure Evaluation (Signed)
Anesthesia Post Note  Patient: [facility] Aguilar  Procedure(s) Performed: Procedure(s) (LRB): CATARACT EXTRACTION PHACO AND INTRAOCULAR LENS PLACEMENT LEFT EYE CDE 26.60 (Left)  Patient location during evaluation: PACU Anesthesia Type: MAC Level of consciousness: awake and alert, oriented and patient cooperative Pain management: pain level controlled Vital Signs Assessment: post-procedure vital signs reviewed and stable Respiratory status: spontaneous breathing, nonlabored ventilation and respiratory function stable Cardiovascular status: blood pressure returned to baseline Postop Assessment: no signs of nausea or vomiting Anesthetic complications: no     Last Vitals:  Vitals:   05/30/16 0945 05/30/16 1000  BP: 129/62 (!) 129/92  Resp: 10 16  Temp:      Last Pain:  Vitals:   05/30/16 0906  TempSrc: Oral                 [name]

## 2016-05-30 NOTE — Transfer of Care (Signed)
Immediate Anesthesia Transfer of Care Note  Patient: [facility]ilar  Procedure(s) Performed: Procedure(s): CATARACT EXTRACTION PHACO AND INTRAOCULAR LENS PLACEMENT LEFT EYE CDE 26.60 (Left)  Patient Location: Short Stay  Anesthesia Type:MAC  Level of Consciousness: awake, alert , oriented and patient cooperative  Airway & Oxygen Therapy: Patient Spontanous Breathing  Post-op Assessment: Report given to RN, Post -op Vital signs reviewed and stable and Patient moving all extremities  Post vital signs: Reviewed and stable  Last Vitals:  Vitals:   05/30/16 0945 05/30/16 1000  BP: 129/62 (!) 129/92  Resp: 10 16  Temp:      Last Pain:  Vitals:   05/30/16 0906  TempSrc: Oral      Patients Stated Pain Goal: 7 (03/15/24 3664)  Complications: No apparent anesthesia complications

## 2016-05-30 NOTE — H&P (Signed)
I have reviewed the H&P, the patient was re-examined, and I have identified no interval changes in medical condition and plan of care since the history and physical of record  

## 2016-05-30 NOTE — Op Note (Signed)
Date of Admission: 05/30/2016  Date of Surgery: 05/30/2016  Pre-Op Dx: Cataract  Left  Eye  Post-Op Dx: Senile Mature Cataract Left Eye,  Dx Code H25.22  Surgeon: Tonny Branch, M.D.  Assistants: None  Anesthesia: Topical with MAC  Indications: Painless, progressive loss of vision with compromise of daily activities.  Surgery: Cataract Extraction with Intraocular lens Implant Left Eye  Discription: The patient had dilating drops and viscous lidocaine placed into the Left eye in the pre-op holding area. After transfer to the operating room, a time out was performed. The patient was then prepped and draped. Beginning with a 16 degree blade a paracentesis port was made at the surgeon's 2 o'clock position. The anterior chamber was then filled with 1% non-preserved lidocaine. The anterior chamber was then filled with Vision Blue to stain the anterior capsule. The Vision Blue was displaced from the anterior chamber with BSS. This was followed by filling the anterior chamber with Viscoat. A 2.30m keratome blade was used to make a clear corneal incision at the temporal limbus. A bent cystatome needle was used to create a continuous tear capsulotomy. Hydrodissection was performed with balanced salt solution on a Fine canula. The lens nucleus was then removed using the phacoemulsification handpiece. Residual cortex was removed with the I&A handpiece. The anterior chamber and capsular bag were refilled with Provisc. A posterior chamber intraocular lens was placed into the capsular bag with it's injector. The implant was positioned with the Kuglan hook. The Provisc was then removed from the anterior chamber and capsular bag with the I&A handpiece. Stromal hydration of the main incision and paracentesis port was performed with BSS on a Fine canula. The wounds were tested for leak which was negative. The patient tolerated the procedure well. There were no operative complications. The patient was then transferred to  the recovery room in stable condition.  Complications: None  Specimen: None  EBL: None  Prosthetic device: B[employer]L enVista, MX60, power 24.0D, SN [id number]

## 2016-05-30 NOTE — Anesthesia Preprocedure Evaluation (Signed)
Anesthesia Evaluation  Patient identified by MRN, date of birth, ID band Patient awake    Reviewed: Allergy & Precautions, NPO status , Patient's Chart, lab work & pertinent test results  Airway Mallampati: III  TM Distance: >3 FB   Mouth opening: Limited Mouth Opening  Dental  (+) Edentulous Upper, Edentulous Lower   Pulmonary neg pulmonary ROS,    breath sounds clear to auscultation       Cardiovascular + DVT   Rhythm:Regular Rate:Normal     Neuro/Psych PSYCHIATRIC DISORDERS Anxiety Bipolar Disorder    GI/Hepatic GERD  ,  Endo/Other    Renal/GU      Musculoskeletal  (+) Arthritis ,   Abdominal   Peds  Hematology   Anesthesia Other Findings   Reproductive/Obstetrics                             Anesthesia Physical Anesthesia Plan  ASA: III  Anesthesia Plan: MAC   Post-op Pain Management:    Induction: Intravenous  Airway Management Planned: Nasal Cannula  Additional Equipment:   Intra-op Plan:   Post-operative Plan:   Informed Consent: I have reviewed the patients History and Physical, chart, labs and discussed the procedure including the risks, benefits and alternatives for the proposed anesthesia with the patient or authorized representative who has indicated his/her understanding and acceptance.     Plan Discussed with:   Anesthesia Plan Comments:         Anesthesia Quick Evaluation

## 2016-05-31 ENCOUNTER — Encounter (HOSPITAL_COMMUNITY): Payer: Self-pay | Admitting: Ophthalmology

## 2020-06-20 DIAGNOSIS — U071 COVID-19: Secondary | ICD-10-CM | POA: Diagnosis not present

## 2020-06-20 DIAGNOSIS — J189 Pneumonia, unspecified organism: Secondary | ICD-10-CM | POA: Diagnosis not present

## 2020-07-06 DIAGNOSIS — F319 Bipolar disorder, unspecified: Secondary | ICD-10-CM | POA: Diagnosis not present

## 2020-07-06 DIAGNOSIS — Z8616 Personal history of COVID-19: Secondary | ICD-10-CM | POA: Diagnosis not present

## 2020-07-06 DIAGNOSIS — R630 Anorexia: Secondary | ICD-10-CM | POA: Diagnosis not present

## 2020-07-12 DIAGNOSIS — L89626 Pressure-induced deep tissue damage of left heel: Secondary | ICD-10-CM | POA: Diagnosis not present

## 2020-07-12 DIAGNOSIS — R269 Unspecified abnormalities of gait and mobility: Secondary | ICD-10-CM | POA: Diagnosis not present

## 2020-07-12 DIAGNOSIS — M6281 Muscle weakness (generalized): Secondary | ICD-10-CM | POA: Diagnosis not present

## 2020-07-12 DIAGNOSIS — N3946 Mixed incontinence: Secondary | ICD-10-CM | POA: Diagnosis not present

## 2020-07-14 DIAGNOSIS — F250 Schizoaffective disorder, bipolar type: Secondary | ICD-10-CM | POA: Diagnosis not present

## 2020-07-14 DIAGNOSIS — F339 Major depressive disorder, recurrent, unspecified: Secondary | ICD-10-CM | POA: Diagnosis not present

## 2020-07-17 DIAGNOSIS — Z8616 Personal history of COVID-19: Secondary | ICD-10-CM | POA: Diagnosis not present

## 2020-07-17 DIAGNOSIS — J189 Pneumonia, unspecified organism: Secondary | ICD-10-CM | POA: Diagnosis not present

## 2020-07-17 DIAGNOSIS — F319 Bipolar disorder, unspecified: Secondary | ICD-10-CM | POA: Diagnosis not present

## 2020-07-17 DIAGNOSIS — F32A Depression, unspecified: Secondary | ICD-10-CM | POA: Diagnosis not present

## 2020-07-18 DIAGNOSIS — R051 Acute cough: Secondary | ICD-10-CM | POA: Diagnosis not present

## 2020-07-19 DIAGNOSIS — M79674 Pain in right toe(s): Secondary | ICD-10-CM | POA: Diagnosis not present

## 2020-07-19 DIAGNOSIS — B351 Tinea unguium: Secondary | ICD-10-CM | POA: Diagnosis not present

## 2020-07-19 DIAGNOSIS — R269 Unspecified abnormalities of gait and mobility: Secondary | ICD-10-CM | POA: Diagnosis not present

## 2020-07-19 DIAGNOSIS — N3946 Mixed incontinence: Secondary | ICD-10-CM | POA: Diagnosis not present

## 2020-07-19 DIAGNOSIS — M79675 Pain in left toe(s): Secondary | ICD-10-CM | POA: Diagnosis not present

## 2020-07-19 DIAGNOSIS — L89626 Pressure-induced deep tissue damage of left heel: Secondary | ICD-10-CM | POA: Diagnosis not present

## 2020-07-19 DIAGNOSIS — M6281 Muscle weakness (generalized): Secondary | ICD-10-CM | POA: Diagnosis not present

## 2020-08-03 DIAGNOSIS — F32A Depression, unspecified: Secondary | ICD-10-CM | POA: Diagnosis not present

## 2020-08-03 DIAGNOSIS — F319 Bipolar disorder, unspecified: Secondary | ICD-10-CM | POA: Diagnosis not present

## 2020-08-04 DIAGNOSIS — Z23 Encounter for immunization: Secondary | ICD-10-CM | POA: Diagnosis not present

## 2020-08-05 DIAGNOSIS — R2689 Other abnormalities of gait and mobility: Secondary | ICD-10-CM | POA: Diagnosis not present

## 2020-08-05 DIAGNOSIS — R531 Weakness: Secondary | ICD-10-CM | POA: Diagnosis not present

## 2020-08-05 DIAGNOSIS — Z8616 Personal history of COVID-19: Secondary | ICD-10-CM | POA: Diagnosis not present

## 2020-08-05 DIAGNOSIS — R262 Difficulty in walking, not elsewhere classified: Secondary | ICD-10-CM | POA: Diagnosis not present

## 2020-08-10 DIAGNOSIS — Z79899 Other long term (current) drug therapy: Secondary | ICD-10-CM | POA: Diagnosis not present

## 2020-08-10 DIAGNOSIS — R630 Anorexia: Secondary | ICD-10-CM | POA: Diagnosis not present

## 2020-08-10 DIAGNOSIS — F259 Schizoaffective disorder, unspecified: Secondary | ICD-10-CM | POA: Diagnosis not present

## 2020-08-10 DIAGNOSIS — K219 Gastro-esophageal reflux disease without esophagitis: Secondary | ICD-10-CM | POA: Diagnosis not present

## 2020-08-16 DIAGNOSIS — D519 Vitamin B12 deficiency anemia, unspecified: Secondary | ICD-10-CM | POA: Diagnosis not present

## 2020-08-16 DIAGNOSIS — E119 Type 2 diabetes mellitus without complications: Secondary | ICD-10-CM | POA: Diagnosis not present

## 2020-08-16 DIAGNOSIS — D649 Anemia, unspecified: Secondary | ICD-10-CM | POA: Diagnosis not present

## 2020-08-16 DIAGNOSIS — E559 Vitamin D deficiency, unspecified: Secondary | ICD-10-CM | POA: Diagnosis not present

## 2020-08-17 DIAGNOSIS — K219 Gastro-esophageal reflux disease without esophagitis: Secondary | ICD-10-CM | POA: Diagnosis not present

## 2020-08-17 DIAGNOSIS — N1831 Chronic kidney disease, stage 3a: Secondary | ICD-10-CM | POA: Diagnosis not present

## 2020-08-17 DIAGNOSIS — R739 Hyperglycemia, unspecified: Secondary | ICD-10-CM | POA: Diagnosis not present

## 2020-08-17 DIAGNOSIS — F3110 Bipolar disorder, current episode manic without psychotic features, unspecified: Secondary | ICD-10-CM | POA: Diagnosis not present

## 2020-08-17 DIAGNOSIS — E039 Hypothyroidism, unspecified: Secondary | ICD-10-CM | POA: Diagnosis not present

## 2020-08-17 DIAGNOSIS — E119 Type 2 diabetes mellitus without complications: Secondary | ICD-10-CM | POA: Diagnosis not present

## 2020-08-31 DIAGNOSIS — K0889 Other specified disorders of teeth and supporting structures: Secondary | ICD-10-CM | POA: Diagnosis not present

## 2020-09-05 DIAGNOSIS — R2689 Other abnormalities of gait and mobility: Secondary | ICD-10-CM | POA: Diagnosis not present

## 2020-09-05 DIAGNOSIS — R262 Difficulty in walking, not elsewhere classified: Secondary | ICD-10-CM | POA: Diagnosis not present

## 2020-09-05 DIAGNOSIS — R531 Weakness: Secondary | ICD-10-CM | POA: Diagnosis not present

## 2020-09-05 DIAGNOSIS — Z8616 Personal history of COVID-19: Secondary | ICD-10-CM | POA: Diagnosis not present

## 2020-09-21 DIAGNOSIS — N184 Chronic kidney disease, stage 4 (severe): Secondary | ICD-10-CM | POA: Diagnosis not present

## 2020-09-21 DIAGNOSIS — M1711 Unilateral primary osteoarthritis, right knee: Secondary | ICD-10-CM | POA: Diagnosis not present

## 2020-09-21 DIAGNOSIS — M1712 Unilateral primary osteoarthritis, left knee: Secondary | ICD-10-CM | POA: Diagnosis not present

## 2020-09-28 DIAGNOSIS — R000 Tachycardia, unspecified: Secondary | ICD-10-CM | POA: Diagnosis not present

## 2020-09-28 DIAGNOSIS — K5900 Constipation, unspecified: Secondary | ICD-10-CM | POA: Diagnosis not present

## 2020-09-28 DIAGNOSIS — I10 Essential (primary) hypertension: Secondary | ICD-10-CM | POA: Diagnosis not present

## 2020-09-28 DIAGNOSIS — F329 Major depressive disorder, single episode, unspecified: Secondary | ICD-10-CM | POA: Diagnosis not present

## 2020-10-05 DIAGNOSIS — R531 Weakness: Secondary | ICD-10-CM | POA: Diagnosis not present

## 2020-10-05 DIAGNOSIS — Z8616 Personal history of COVID-19: Secondary | ICD-10-CM | POA: Diagnosis not present

## 2020-10-05 DIAGNOSIS — R262 Difficulty in walking, not elsewhere classified: Secondary | ICD-10-CM | POA: Diagnosis not present

## 2020-10-05 DIAGNOSIS — R2689 Other abnormalities of gait and mobility: Secondary | ICD-10-CM | POA: Diagnosis not present

## 2020-10-19 DIAGNOSIS — L89151 Pressure ulcer of sacral region, stage 1: Secondary | ICD-10-CM | POA: Diagnosis not present

## 2020-11-05 DIAGNOSIS — R262 Difficulty in walking, not elsewhere classified: Secondary | ICD-10-CM | POA: Diagnosis not present

## 2020-11-05 DIAGNOSIS — R2689 Other abnormalities of gait and mobility: Secondary | ICD-10-CM | POA: Diagnosis not present

## 2020-11-05 DIAGNOSIS — R531 Weakness: Secondary | ICD-10-CM | POA: Diagnosis not present

## 2020-11-05 DIAGNOSIS — Z8616 Personal history of COVID-19: Secondary | ICD-10-CM | POA: Diagnosis not present

## 2020-11-23 DIAGNOSIS — K219 Gastro-esophageal reflux disease without esophagitis: Secondary | ICD-10-CM | POA: Diagnosis not present

## 2020-11-23 DIAGNOSIS — M170 Bilateral primary osteoarthritis of knee: Secondary | ICD-10-CM | POA: Diagnosis not present

## 2020-11-23 DIAGNOSIS — I10 Essential (primary) hypertension: Secondary | ICD-10-CM | POA: Diagnosis not present

## 2020-11-23 DIAGNOSIS — F419 Anxiety disorder, unspecified: Secondary | ICD-10-CM | POA: Diagnosis not present

## 2020-11-23 DIAGNOSIS — F319 Bipolar disorder, unspecified: Secondary | ICD-10-CM | POA: Diagnosis not present

## 2020-11-23 DIAGNOSIS — F32A Depression, unspecified: Secondary | ICD-10-CM | POA: Diagnosis not present

## 2020-11-23 DIAGNOSIS — E039 Hypothyroidism, unspecified: Secondary | ICD-10-CM | POA: Diagnosis not present

## 2020-11-27 DIAGNOSIS — F319 Bipolar disorder, unspecified: Secondary | ICD-10-CM | POA: Diagnosis not present

## 2020-11-27 DIAGNOSIS — E039 Hypothyroidism, unspecified: Secondary | ICD-10-CM | POA: Diagnosis not present

## 2020-11-27 DIAGNOSIS — I10 Essential (primary) hypertension: Secondary | ICD-10-CM | POA: Diagnosis not present

## 2020-11-27 DIAGNOSIS — K219 Gastro-esophageal reflux disease without esophagitis: Secondary | ICD-10-CM | POA: Diagnosis not present

## 2020-11-27 DIAGNOSIS — F419 Anxiety disorder, unspecified: Secondary | ICD-10-CM | POA: Diagnosis not present

## 2020-11-27 DIAGNOSIS — F32A Depression, unspecified: Secondary | ICD-10-CM | POA: Diagnosis not present

## 2020-11-27 DIAGNOSIS — M170 Bilateral primary osteoarthritis of knee: Secondary | ICD-10-CM | POA: Diagnosis not present

## 2020-11-29 DIAGNOSIS — K219 Gastro-esophageal reflux disease without esophagitis: Secondary | ICD-10-CM | POA: Diagnosis not present

## 2020-11-29 DIAGNOSIS — F32A Depression, unspecified: Secondary | ICD-10-CM | POA: Diagnosis not present

## 2020-11-29 DIAGNOSIS — F419 Anxiety disorder, unspecified: Secondary | ICD-10-CM | POA: Diagnosis not present

## 2020-11-29 DIAGNOSIS — E039 Hypothyroidism, unspecified: Secondary | ICD-10-CM | POA: Diagnosis not present

## 2020-11-29 DIAGNOSIS — I10 Essential (primary) hypertension: Secondary | ICD-10-CM | POA: Diagnosis not present

## 2020-11-29 DIAGNOSIS — M170 Bilateral primary osteoarthritis of knee: Secondary | ICD-10-CM | POA: Diagnosis not present

## 2020-11-29 DIAGNOSIS — F319 Bipolar disorder, unspecified: Secondary | ICD-10-CM | POA: Diagnosis not present

## 2020-12-04 DIAGNOSIS — F319 Bipolar disorder, unspecified: Secondary | ICD-10-CM | POA: Diagnosis not present

## 2020-12-04 DIAGNOSIS — I10 Essential (primary) hypertension: Secondary | ICD-10-CM | POA: Diagnosis not present

## 2020-12-04 DIAGNOSIS — K219 Gastro-esophageal reflux disease without esophagitis: Secondary | ICD-10-CM | POA: Diagnosis not present

## 2020-12-04 DIAGNOSIS — M170 Bilateral primary osteoarthritis of knee: Secondary | ICD-10-CM | POA: Diagnosis not present

## 2020-12-04 DIAGNOSIS — F419 Anxiety disorder, unspecified: Secondary | ICD-10-CM | POA: Diagnosis not present

## 2020-12-04 DIAGNOSIS — F32A Depression, unspecified: Secondary | ICD-10-CM | POA: Diagnosis not present

## 2020-12-04 DIAGNOSIS — E039 Hypothyroidism, unspecified: Secondary | ICD-10-CM | POA: Diagnosis not present

## 2020-12-05 DIAGNOSIS — R262 Difficulty in walking, not elsewhere classified: Secondary | ICD-10-CM | POA: Diagnosis not present

## 2020-12-05 DIAGNOSIS — Z8616 Personal history of COVID-19: Secondary | ICD-10-CM | POA: Diagnosis not present

## 2020-12-05 DIAGNOSIS — R2689 Other abnormalities of gait and mobility: Secondary | ICD-10-CM | POA: Diagnosis not present

## 2020-12-05 DIAGNOSIS — R531 Weakness: Secondary | ICD-10-CM | POA: Diagnosis not present

## 2020-12-07 DIAGNOSIS — M170 Bilateral primary osteoarthritis of knee: Secondary | ICD-10-CM | POA: Diagnosis not present

## 2020-12-07 DIAGNOSIS — E039 Hypothyroidism, unspecified: Secondary | ICD-10-CM | POA: Diagnosis not present

## 2020-12-07 DIAGNOSIS — F32A Depression, unspecified: Secondary | ICD-10-CM | POA: Diagnosis not present

## 2020-12-07 DIAGNOSIS — K219 Gastro-esophageal reflux disease without esophagitis: Secondary | ICD-10-CM | POA: Diagnosis not present

## 2020-12-07 DIAGNOSIS — F319 Bipolar disorder, unspecified: Secondary | ICD-10-CM | POA: Diagnosis not present

## 2020-12-07 DIAGNOSIS — I10 Essential (primary) hypertension: Secondary | ICD-10-CM | POA: Diagnosis not present

## 2020-12-07 DIAGNOSIS — F419 Anxiety disorder, unspecified: Secondary | ICD-10-CM | POA: Diagnosis not present

## 2021-01-05 DIAGNOSIS — R2689 Other abnormalities of gait and mobility: Secondary | ICD-10-CM | POA: Diagnosis not present

## 2021-01-05 DIAGNOSIS — R262 Difficulty in walking, not elsewhere classified: Secondary | ICD-10-CM | POA: Diagnosis not present

## 2021-01-05 DIAGNOSIS — Z8616 Personal history of COVID-19: Secondary | ICD-10-CM | POA: Diagnosis not present

## 2021-01-05 DIAGNOSIS — R531 Weakness: Secondary | ICD-10-CM | POA: Diagnosis not present

## 2021-01-25 DIAGNOSIS — H0014 Chalazion left upper eyelid: Secondary | ICD-10-CM | POA: Diagnosis not present

## 2021-02-05 DIAGNOSIS — R262 Difficulty in walking, not elsewhere classified: Secondary | ICD-10-CM | POA: Diagnosis not present

## 2021-02-05 DIAGNOSIS — Z8616 Personal history of COVID-19: Secondary | ICD-10-CM | POA: Diagnosis not present

## 2021-02-05 DIAGNOSIS — R2689 Other abnormalities of gait and mobility: Secondary | ICD-10-CM | POA: Diagnosis not present

## 2021-02-05 DIAGNOSIS — R531 Weakness: Secondary | ICD-10-CM | POA: Diagnosis not present

## 2021-02-28 DIAGNOSIS — E119 Type 2 diabetes mellitus without complications: Secondary | ICD-10-CM | POA: Diagnosis not present

## 2021-02-28 DIAGNOSIS — E559 Vitamin D deficiency, unspecified: Secondary | ICD-10-CM | POA: Diagnosis not present

## 2021-02-28 DIAGNOSIS — D649 Anemia, unspecified: Secondary | ICD-10-CM | POA: Diagnosis not present

## 2021-02-28 DIAGNOSIS — D519 Vitamin B12 deficiency anemia, unspecified: Secondary | ICD-10-CM | POA: Diagnosis not present

## 2021-03-06 DIAGNOSIS — L03032 Cellulitis of left toe: Secondary | ICD-10-CM | POA: Diagnosis not present

## 2021-03-06 DIAGNOSIS — L600 Ingrowing nail: Secondary | ICD-10-CM | POA: Diagnosis not present

## 2021-03-06 DIAGNOSIS — M79675 Pain in left toe(s): Secondary | ICD-10-CM | POA: Diagnosis not present

## 2021-03-06 DIAGNOSIS — B351 Tinea unguium: Secondary | ICD-10-CM | POA: Diagnosis not present

## 2021-03-06 DIAGNOSIS — M79674 Pain in right toe(s): Secondary | ICD-10-CM | POA: Diagnosis not present

## 2021-03-06 DIAGNOSIS — L602 Onychogryphosis: Secondary | ICD-10-CM | POA: Diagnosis not present

## 2021-03-07 DIAGNOSIS — R2689 Other abnormalities of gait and mobility: Secondary | ICD-10-CM | POA: Diagnosis not present

## 2021-03-07 DIAGNOSIS — R531 Weakness: Secondary | ICD-10-CM | POA: Diagnosis not present

## 2021-03-07 DIAGNOSIS — R262 Difficulty in walking, not elsewhere classified: Secondary | ICD-10-CM | POA: Diagnosis not present

## 2021-03-07 DIAGNOSIS — Z8616 Personal history of COVID-19: Secondary | ICD-10-CM | POA: Diagnosis not present

## 2021-03-08 DIAGNOSIS — E785 Hyperlipidemia, unspecified: Secondary | ICD-10-CM | POA: Diagnosis not present

## 2021-03-08 DIAGNOSIS — E559 Vitamin D deficiency, unspecified: Secondary | ICD-10-CM | POA: Diagnosis not present

## 2021-03-08 DIAGNOSIS — E039 Hypothyroidism, unspecified: Secondary | ICD-10-CM | POA: Diagnosis not present

## 2021-03-08 DIAGNOSIS — N184 Chronic kidney disease, stage 4 (severe): Secondary | ICD-10-CM | POA: Diagnosis not present

## 2021-05-28 DIAGNOSIS — E119 Type 2 diabetes mellitus without complications: Secondary | ICD-10-CM | POA: Diagnosis not present

## 2021-05-28 DIAGNOSIS — D519 Vitamin B12 deficiency anemia, unspecified: Secondary | ICD-10-CM | POA: Diagnosis not present

## 2021-05-28 DIAGNOSIS — E559 Vitamin D deficiency, unspecified: Secondary | ICD-10-CM | POA: Diagnosis not present

## 2021-08-27 DIAGNOSIS — E119 Type 2 diabetes mellitus without complications: Secondary | ICD-10-CM | POA: Diagnosis not present

## 2021-08-27 DIAGNOSIS — D510 Vitamin B12 deficiency anemia due to intrinsic factor deficiency: Secondary | ICD-10-CM | POA: Diagnosis not present

## 2021-08-27 DIAGNOSIS — E559 Vitamin D deficiency, unspecified: Secondary | ICD-10-CM | POA: Diagnosis not present

## 2021-08-27 DIAGNOSIS — I10 Essential (primary) hypertension: Secondary | ICD-10-CM | POA: Diagnosis not present

## 2021-09-19 DIAGNOSIS — K219 Gastro-esophageal reflux disease without esophagitis: Secondary | ICD-10-CM | POA: Diagnosis not present

## 2021-09-19 DIAGNOSIS — E559 Vitamin D deficiency, unspecified: Secondary | ICD-10-CM | POA: Diagnosis not present

## 2021-09-19 DIAGNOSIS — F339 Major depressive disorder, recurrent, unspecified: Secondary | ICD-10-CM | POA: Diagnosis not present

## 2021-09-19 DIAGNOSIS — D508 Other iron deficiency anemias: Secondary | ICD-10-CM | POA: Diagnosis not present

## 2021-09-19 DIAGNOSIS — E039 Hypothyroidism, unspecified: Secondary | ICD-10-CM | POA: Diagnosis not present

## 2021-09-19 DIAGNOSIS — M1990 Unspecified osteoarthritis, unspecified site: Secondary | ICD-10-CM | POA: Diagnosis not present

## 2021-09-19 DIAGNOSIS — E785 Hyperlipidemia, unspecified: Secondary | ICD-10-CM | POA: Diagnosis not present

## 2021-09-19 DIAGNOSIS — K5900 Constipation, unspecified: Secondary | ICD-10-CM | POA: Diagnosis not present

## 2021-09-19 DIAGNOSIS — I10 Essential (primary) hypertension: Secondary | ICD-10-CM | POA: Diagnosis not present

## 2021-09-21 DIAGNOSIS — I10 Essential (primary) hypertension: Secondary | ICD-10-CM | POA: Diagnosis not present

## 2021-09-21 DIAGNOSIS — D508 Other iron deficiency anemias: Secondary | ICD-10-CM | POA: Diagnosis not present

## 2021-09-21 DIAGNOSIS — R5383 Other fatigue: Secondary | ICD-10-CM | POA: Diagnosis not present

## 2021-09-21 DIAGNOSIS — E559 Vitamin D deficiency, unspecified: Secondary | ICD-10-CM | POA: Diagnosis not present

## 2021-10-08 DIAGNOSIS — L989 Disorder of the skin and subcutaneous tissue, unspecified: Secondary | ICD-10-CM | POA: Diagnosis not present

## 2021-10-08 DIAGNOSIS — R54 Age-related physical debility: Secondary | ICD-10-CM | POA: Diagnosis not present

## 2022-01-29 DIAGNOSIS — I10 Essential (primary) hypertension: Secondary | ICD-10-CM | POA: Diagnosis not present

## 2022-01-29 DIAGNOSIS — N184 Chronic kidney disease, stage 4 (severe): Secondary | ICD-10-CM | POA: Diagnosis not present

## 2022-01-29 DIAGNOSIS — M150 Primary generalized (osteo)arthritis: Secondary | ICD-10-CM | POA: Diagnosis not present

## 2022-02-04 DIAGNOSIS — R944 Abnormal results of kidney function studies: Secondary | ICD-10-CM | POA: Diagnosis not present

## 2022-03-05 DIAGNOSIS — N184 Chronic kidney disease, stage 4 (severe): Secondary | ICD-10-CM | POA: Diagnosis not present

## 2022-03-11 DIAGNOSIS — E119 Type 2 diabetes mellitus without complications: Secondary | ICD-10-CM | POA: Diagnosis not present

## 2022-03-13 DIAGNOSIS — E875 Hyperkalemia: Secondary | ICD-10-CM | POA: Diagnosis not present

## 2022-03-13 DIAGNOSIS — N184 Chronic kidney disease, stage 4 (severe): Secondary | ICD-10-CM | POA: Diagnosis not present

## 2022-04-20 DIAGNOSIS — I10 Essential (primary) hypertension: Secondary | ICD-10-CM | POA: Diagnosis not present

## 2022-04-20 DIAGNOSIS — K219 Gastro-esophageal reflux disease without esophagitis: Secondary | ICD-10-CM | POA: Diagnosis not present

## 2022-06-02 DIAGNOSIS — I10 Essential (primary) hypertension: Secondary | ICD-10-CM | POA: Diagnosis not present

## 2022-06-02 DIAGNOSIS — F418 Other specified anxiety disorders: Secondary | ICD-10-CM | POA: Diagnosis not present

## 2022-06-02 DIAGNOSIS — E039 Hypothyroidism, unspecified: Secondary | ICD-10-CM | POA: Diagnosis not present

## 2022-06-22 DIAGNOSIS — I10 Essential (primary) hypertension: Secondary | ICD-10-CM | POA: Diagnosis not present

## 2022-06-22 DIAGNOSIS — E039 Hypothyroidism, unspecified: Secondary | ICD-10-CM | POA: Diagnosis not present

## 2022-06-22 DIAGNOSIS — F418 Other specified anxiety disorders: Secondary | ICD-10-CM | POA: Diagnosis not present

## 2022-06-27 DIAGNOSIS — E875 Hyperkalemia: Secondary | ICD-10-CM | POA: Diagnosis not present

## 2022-06-27 DIAGNOSIS — N184 Chronic kidney disease, stage 4 (severe): Secondary | ICD-10-CM | POA: Diagnosis not present

## 2022-06-27 DIAGNOSIS — E8722 Chronic metabolic acidosis: Secondary | ICD-10-CM | POA: Diagnosis not present

## 2022-06-27 DIAGNOSIS — I10 Essential (primary) hypertension: Secondary | ICD-10-CM | POA: Diagnosis not present

## 2022-07-11 DIAGNOSIS — F418 Other specified anxiety disorders: Secondary | ICD-10-CM | POA: Diagnosis not present

## 2022-07-11 DIAGNOSIS — K219 Gastro-esophageal reflux disease without esophagitis: Secondary | ICD-10-CM | POA: Diagnosis not present

## 2022-07-11 DIAGNOSIS — I10 Essential (primary) hypertension: Secondary | ICD-10-CM | POA: Diagnosis not present

## 2022-07-11 DIAGNOSIS — E039 Hypothyroidism, unspecified: Secondary | ICD-10-CM | POA: Diagnosis not present

## 2022-07-16 DIAGNOSIS — F331 Major depressive disorder, recurrent, moderate: Secondary | ICD-10-CM | POA: Diagnosis not present

## 2022-07-21 DIAGNOSIS — E039 Hypothyroidism, unspecified: Secondary | ICD-10-CM | POA: Diagnosis not present

## 2022-07-21 DIAGNOSIS — I10 Essential (primary) hypertension: Secondary | ICD-10-CM | POA: Diagnosis not present

## 2022-08-12 DIAGNOSIS — F331 Major depressive disorder, recurrent, moderate: Secondary | ICD-10-CM | POA: Diagnosis not present

## 2022-08-15 DIAGNOSIS — R531 Weakness: Secondary | ICD-10-CM | POA: Diagnosis not present

## 2022-08-15 DIAGNOSIS — F418 Other specified anxiety disorders: Secondary | ICD-10-CM | POA: Diagnosis not present

## 2022-08-15 DIAGNOSIS — E039 Hypothyroidism, unspecified: Secondary | ICD-10-CM | POA: Diagnosis not present

## 2022-08-15 DIAGNOSIS — L659 Nonscarring hair loss, unspecified: Secondary | ICD-10-CM | POA: Diagnosis not present

## 2022-08-16 DIAGNOSIS — E079 Disorder of thyroid, unspecified: Secondary | ICD-10-CM | POA: Diagnosis not present
